# Patient Record
Sex: Male | Born: 2000 | Race: Black or African American | Hispanic: No | Marital: Single | Smoking: Never smoker
Health system: Southern US, Community
[De-identification: ages and names within clinical notes are randomized; demographics above are authoritative.]

## PROBLEM LIST (undated history)

## (undated) DIAGNOSIS — J45909 Unspecified asthma, uncomplicated: Secondary | ICD-10-CM

---
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
## 2023-01-08 ENCOUNTER — Other Ambulatory Visit: Payer: Self-pay

## 2023-01-08 ENCOUNTER — Encounter (HOSPITAL_COMMUNITY): Payer: Self-pay

## 2023-01-08 ENCOUNTER — Emergency Department (HOSPITAL_COMMUNITY)
Admission: EM | Admit: 2023-01-08 | Discharge: 2023-01-08 | Disposition: A | Discharge status: Home / Self Care | Payer: Medicaid Other | Attending: Student | Admitting: Student

## 2023-01-08 DIAGNOSIS — J45909 Unspecified asthma, uncomplicated: Secondary | ICD-10-CM | POA: Diagnosis not present

## 2023-01-08 DIAGNOSIS — J392 Other diseases of pharynx: Secondary | ICD-10-CM

## 2023-01-08 DIAGNOSIS — R0602 Shortness of breath: Secondary | ICD-10-CM | POA: Diagnosis not present

## 2023-01-08 DIAGNOSIS — R220 Localized swelling, mass and lump, head: Secondary | ICD-10-CM | POA: Insufficient documentation

## 2023-01-08 DIAGNOSIS — F419 Anxiety disorder, unspecified: Secondary | ICD-10-CM

## 2023-01-08 HISTORY — DX: Unspecified asthma, uncomplicated: J45.909

## 2023-01-08 MED ORDER — VICKS MINI COOLMIST HUMIDIFIER MISC: 1.0000 | 0 refills | Status: DC | PRN

## 2023-01-08 NOTE — ED Provider Notes (Signed)
[facility] EMERGENCY DEPARTMENT AT [facility] Provider Note  CSN: [id number] Arrival date & time: 01/08/23 2020  Chief Complaint(s) Oral Swelling  HPI Taylor Walls is a 22 y.o. male with PMH asthma, anxiety who presents emergency department for evaluation of concern for oropharyngeal swelling and a feeling of shortness of breath where he "feels like he is drinking out of a straw".  He states that he was seen at [facility] and ultimately discharged with Atarax but this has not been helping.  He states that he used to self medicate his anxiety with marijuana but since not smoked any marijuana for over 2 months.  He states that his symptoms began abruptly during finals week and have persisted since graduation.  He states that he feels immense pressure in his personal life and has multiple stressors at home that seem to be exacerbating his symptoms.  He states that when he is physically active, he does not feel this but when he is alone and thinking about the symptoms they seem to become way worse.  He states he feels that his throat becomes very dry and he has trouble breathing.  He denies dysphagia, stridor, sore throat, chest pain or any other systemic symptoms.   Past Medical History Past Medical History:  Diagnosis Date   Asthma    Patient Active Problem List   Diagnosis Date Noted   Left leg pain 12/06/2017   Home Medication(s) Prior to Admission medications   Medication Sig Start Date End Date Taking? Authorizing Provider  HYDROcodone-acetaminophen (NORCO/VICODIN) 5-325 MG tablet Take 1 tablet by mouth every 4 (four) hours as needed for severe pain. Patient not taking: Reported on 12/15/2017 11/14/17   Cristina Gong, PA-C  ondansetron (ZOFRAN ODT) 4 MG disintegrating tablet Take 1 tablet (4 mg total) by mouth every 8 (eight) hours as needed. 05/13/18   Pricilla Loveless, MD                                                                                                                                     Past Surgical History History reviewed. No pertinent surgical history. Family History History reviewed. No pertinent family history.  Social History Social History   Tobacco Use   Smoking status: Never   Smokeless tobacco: Never   Allergies Ibuprofen  Review of Systems Review of Systems  Respiratory:  Positive for chest tightness and shortness of breath.     Physical Exam Vital Signs  I have reviewed the triage vital signs BP (!) 145/84   Pulse (!) 56   Temp 99.7 F (37.6 C) (Oral)   Resp 16   Ht 5\' 10"  (1.778 m)   Wt 79.4 kg   SpO2 99%   BMI 25.11 kg/m   Physical Exam Constitutional:      General: He is not in acute distress.    Appearance: Normal appearance.  HENT:  Head: Normocephalic and atraumatic.     Nose: No congestion or rhinorrhea.  Eyes:     General:        Right eye: No discharge.        Left eye: No discharge.     Extraocular Movements: Extraocular movements intact.     Pupils: Pupils are equal, round, and reactive to light.  Cardiovascular:     Rate and Rhythm: Normal rate and regular rhythm.     Heart sounds: No murmur heard. Pulmonary:     Effort: No respiratory distress.     Breath sounds: No wheezing or rales.  Abdominal:     General: There is no distension.     Tenderness: There is no abdominal tenderness.  Musculoskeletal:        General: Normal range of motion.     Cervical back: Normal range of motion.  Skin:    General: Skin is warm and dry.  Neurological:     General: No focal deficit present.     Mental Status: He is alert.     ED Results and Treatments Labs (all labs ordered are listed, but only abnormal results are displayed) Labs Reviewed - No data to display                                                                                                                        Radiology No results found.  Pertinent labs & imaging results that were available  during my care of the patient were reviewed by me and considered in my medical decision making (see MDM for details).  Medications Ordered in ED Medications - No data to display                                                                                                                                   Procedures Procedures  (including critical care time)  Medical Decision Making / ED Course   This patient presents to the ED for concern of shortness of breath this involves an extensive number of treatment options, and is a complaint that carries with it a high risk of complications and morbidity.  The differential diagnosis includes anxiety, panic disorder, somatic symptom disorder, PTA, RPA, uvulitis, asthma  MDM: Patient seen emergency room for evaluation of multiple complaints described above.  Physical exam is unremarkable with no appreciable oropharyngeal swelling, no tonsillar swelling, no oropharyngeal erythema.  Patient has full range of motion of the neck and pulmonary exam is unremarkable with no appreciable wheezing.  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  He currently denies SI, HI AH or VH and does not meet inpatient psychiatric evaluation criteria.  I gave him resources to follow-up with the [facility] and encouraged him to use the Atarax as needed.  Will trial some Vicks humidified vapor for his dry throat.  I did offer the patient an x-ray soft tissue neck but at this time, patient would likely not benefit from CT imaging of the neck or additional laboratory workup in the ER.  Patient declined further imaging which I think is reasonable.  Patient voiced understanding of this plan  and is in agreement with this plan.  He will return to the emergency department if symptoms significantly worsen or he develops stridor, dysphagia or exertional shortness of breath.  Patient dentist urged with outpatient [facility] follow-up and return precautions   Additional history obtained: -External records from outside source obtained and reviewed including: Chart review including previous notes, labs, imaging, consultation notes   Medicines ordered and prescription drug management: No orders of the defined types were placed in this encounter.   -I have reviewed the patients home medicines and have made adjustments as needed  Critical interventions none    Cardiac Monitoring: The patient was maintained on a cardiac monitor.  I personally viewed and interpreted the cardiac monitored which showed an underlying rhythm of: NSR  Social Determinants of Health:  Factors impacting patients care include: Multiple stressors in his life exacerbating symptoms   Reevaluation: After the interventions noted above, I reevaluated the patient and found that they have :improved  Co morbidities that complicate the patient evaluation  Past Medical History:  Diagnosis Date   Asthma       Dispostion: I considered admission for this patient, but at this time he does not meet inpatient criteria for admission he is safe for discharge with outpatient follow-up     Final Clinical Impression(s) / ED Diagnoses Final diagnoses:  None     @PCDICTATION @    Glendora Score, MD 01/08/23 2159

## 2023-01-08 NOTE — ED Triage Notes (Signed)
Pt arrives c/o swelling to back of throat that has been intermittent x1 month. Denies sore throat or painful swallowing. Denies fevers/chills. States that the swelling makes him feel like he's "breathing through a straw" at times. Able to handle secretions and speak in full sentences during triage.  Was seen in ED about a month ago for similar symptoms and prescribed 3 day course of atarax and albuterol inhaler, but states that didn't help.

## 2023-01-14 ENCOUNTER — Encounter: Payer: Self-pay | Admitting: Family Medicine

## 2023-01-14 ENCOUNTER — Ambulatory Visit (INDEPENDENT_AMBULATORY_CARE_PROVIDER_SITE_OTHER): Payer: Medicaid Other | Admitting: Family Medicine

## 2023-01-14 VITALS — BP 134/82 | HR 56 | Temp 97.8°F | Ht 69.5 in | Wt 169.2 lb

## 2023-01-14 DIAGNOSIS — F4322 Adjustment disorder with anxiety: Secondary | ICD-10-CM

## 2023-01-14 DIAGNOSIS — J029 Acute pharyngitis, unspecified: Secondary | ICD-10-CM

## 2023-01-14 DIAGNOSIS — K219 Gastro-esophageal reflux disease without esophagitis: Secondary | ICD-10-CM | POA: Diagnosis not present

## 2023-01-14 DIAGNOSIS — H6122 Impacted cerumen, left ear: Secondary | ICD-10-CM | POA: Diagnosis not present

## 2023-01-14 LAB — POCT RAPID STREP A (OFFICE): Rapid Strep A Screen: NEGATIVE

## 2023-01-14 LAB — POCT MONO (EPSTEIN BARR VIRUS): Mono, POC: NEGATIVE

## 2023-01-14 MED ORDER — ACETIC ACID 2 % OT SOLN: 4.0000 [drp] | OTIC | 0 refills | Status: AC

## 2023-01-14 MED ORDER — ESCITALOPRAM OXALATE 10 MG PO TABS: ORAL_TABLET | ORAL | 0 refills | Status: DC

## 2023-01-14 MED ORDER — FAMOTIDINE 40 MG PO TABS: 40.0000 mg | ORAL_TABLET | Freq: Every day | ORAL | 0 refills | Status: DC

## 2023-01-14 NOTE — Progress Notes (Signed)
Assessment/Plan:   Problem List Items Addressed This Visit       Respiratory   Pharyngitis - Primary    Differential Diagnosis:  Silent Reflux: The patient's symptoms, including throat discomfort and occasional heartburn, are suggestive of possible silent reflux. Post-Nasal Drip: Given the presence of tonsil stones, post-nasal drip could be causing throat irritation. Infectious Causes: Less likely but considered, including strep throat or chronic bacterial infection.  Plan:  Medication: Prescribe Famotidine (Pepcid) 40 mg tablet at night to manage possible silent reflux. Testing: As ordered to rule out infectious etiologies Follow-up: Evaluate the effectiveness of treatment and follow up in a month for reassessment.      Relevant Medications   famotidine (PEPCID) 40 MG tablet   Other Relevant Orders   POCT rapid strep A (Completed)   HIV antibody (with reflex) (Completed)   POCT Mono (Epstein Barr Virus) (Completed)   GC/CT Probe, Amp (Throat) (Completed)     Digestive   Gastroesophageal reflux disease   Relevant Medications   famotidine (PEPCID) 40 MG tablet     Nervous and Auditory   Impacted cerumen, left ear   Relevant Medications   acetic acid 2 % otic solution     Other   Adjustment disorder with anxiety   Relevant Medications   escitalopram (LEXAPRO) 10 MG tablet    Medications Discontinued During This Encounter  Medication Reason   Humidifiers (VICKS MINI COOLMIST HUMIDIFIER) MISC    HYDROcodone-acetaminophen (NORCO/VICODIN) 5-325 MG tablet    ondansetron (ZOFRAN ODT) 4 MG disintegrating tablet     Return for Sore throat, anxiety.    Subjective:   Encounter date: 01/14/2023  Taylor Walls is a 22 y.o. male who has Left leg pain; Pharyngitis; Gastroesophageal reflux disease; Adjustment disorder with anxiety; and Impacted cerumen, left ear on their problem list..   He  has a past medical history of Asthma..   CHIEF COMPLAINT: The patient  presents with throat discomfort and anxiety concerns.  HISTORY OF PRESENT ILLNESS:   The patient, Taylor Walls, a 22 year old male, reports that he has been experiencing throat swelling and discomfort for the past month and a half, particularly describing it as feeling like "breathing through a straw." He notes that his tonsils feel inflamed and he has coughed up small tonsil stones. The patient has ceased vaping and smoking two months ago and denies any current use. He also reports unmanaged anxiety exacerbating these symptoms. The concern has led to multiple ER visits since April.       01/14/2023   10:38 AM  Depression screen PHQ 2/9  Decreased Interest 1  Down, Depressed, Hopeless 1  PHQ - 2 Score 2  Altered sleeping 2  Tired, decreased energy 1  Change in appetite 0  Feeling bad or failure about yourself  0  Trouble concentrating 0  Moving slowly or fidgety/restless 2  Suicidal thoughts 0  PHQ-9 Score 7  Difficult doing work/chores Somewhat difficult      01/14/2023   10:38 AM  GAD 7 : Generalized Anxiety Score  Nervous, Anxious, on Edge 3  Control/stop worrying 3  Worry too much - different things 3  Trouble relaxing 3  Restless 3  Easily annoyed or irritable 1  Afraid - awful might happen 1  Total GAD 7 Score 17  Anxiety Difficulty Somewhat difficult      Review of Systems  Constitutional:  Negative for chills, diaphoresis, fever, malaise/fatigue and weight loss.  HENT:  Negative for congestion, ear  discharge, ear pain and hearing loss.        Throat swelling, tonsil screen  Eyes:  Negative for blurred vision, double vision, photophobia, pain, discharge and redness.  Respiratory:  Positive for shortness of breath. Negative for cough, sputum production and wheezing.   Cardiovascular:  Negative for chest pain and palpitations.  Gastrointestinal:  Positive for heartburn (Occasional, improved with Tums). Negative for abdominal pain, blood in stool, constipation,  diarrhea, melena, nausea and vomiting.  Genitourinary:  Negative for dysuria, flank pain, frequency, hematuria and urgency.  Musculoskeletal:  Negative for myalgias.  Skin:  Negative for itching and rash.  Neurological:  Negative for dizziness, tingling, tremors, speech change, seizures, loss of consciousness, weakness and headaches.  Psychiatric/Behavioral:  Negative for depression, hallucinations, memory loss, substance abuse and suicidal ideas. The patient is nervous/anxious. The patient does not have insomnia.   All other systems reviewed and are negative.     01/14/2023   10:38 AM  Depression screen PHQ 2/9  Decreased Interest 1  Down, Depressed, Hopeless 1  PHQ - 2 Score 2  Altered sleeping 2  Tired, decreased energy 1  Change in appetite 0  Feeling bad or failure about yourself  0  Trouble concentrating 0  Moving slowly or fidgety/restless 2  Suicidal thoughts 0  PHQ-9 Score 7  Difficult doing work/chores Somewhat difficult      01/14/2023   10:38 AM  GAD 7 : Generalized Anxiety Score  Nervous, Anxious, on Edge 3  Control/stop worrying 3  Worry too much - different things 3  Trouble relaxing 3  Restless 3  Easily annoyed or irritable 1  Afraid - awful might happen 1  Total GAD 7 Score 17  Anxiety Difficulty Somewhat difficult     History reviewed. No pertinent surgical history.  Outpatient Medications Prior to Visit  Medication Sig Dispense Refill   Humidifiers (VICKS MINI COOLMIST HUMIDIFIER) MISC 1 each by Does not apply route as needed (for dry throat). (Patient not taking: Reported on 01/14/2023) 1 each 0   HYDROcodone-acetaminophen (NORCO/VICODIN) 5-325 MG tablet Take 1 tablet by mouth every 4 (four) hours as needed for severe pain. (Patient not taking: Reported on 12/15/2017) 5 tablet 0   ondansetron (ZOFRAN ODT) 4 MG disintegrating tablet Take 1 tablet (4 mg total) by mouth every 8 (eight) hours as needed. (Patient not taking: Reported on 01/14/2023) 10 tablet  0   No facility-administered medications prior to visit.    History reviewed. No pertinent family history.  Social History   Socioeconomic History   Marital status: Single    Spouse name: Not on file   Number of children: Not on file   Years of education: Not on file   Highest education level: Not on file  Occupational History   Not on file  Tobacco Use   Smoking status: Never    Passive exposure: Never   Smokeless tobacco: Never  Vaping Use   Vaping Use: Former  Substance and Sexual Activity   Alcohol use: Never   Drug use: Not Currently    Types: Marijuana   Sexual activity: Yes    Birth control/protection: Condom  Other Topics Concern   Not on file  Social History Narrative   Not on file   Social Determinants of Health   Financial Resource Strain: Not on file  Food Insecurity: Not on file  Transportation Needs: Not on file  Physical Activity: Not on file  Stress: Not on file  Social Connections: Not on file  Intimate Partner Violence: Not on file                                                                                                  Objective:  Physical Exam: BP 134/82 (BP Location: Left Arm, Patient Position: Sitting, Cuff Size: Large)   Pulse (!) 56   Temp 97.8 F (36.6 C) (Temporal)   Ht 5' 9.5" (1.765 m)   Wt 169 lb 3.2 oz (76.7 kg)   SpO2 99%   BMI 24.63 kg/m     Physical Exam Constitutional:      Appearance: Normal appearance.  HENT:     Head: Normocephalic and atraumatic.     Right Ear: Hearing normal.     Left Ear: Hearing normal. There is impacted cerumen.     Nose: Nose normal.     Mouth/Throat:     Lips: Pink.     Mouth: Mucous membranes are moist. No oral lesions.  Eyes:     General: No scleral icterus.       Right eye: No discharge.        Left eye: No discharge.     Extraocular Movements: Extraocular movements intact.  Cardiovascular:     Rate and Rhythm: Normal rate and regular rhythm.     Heart sounds: Normal  heart sounds.  Pulmonary:     Effort: Pulmonary effort is normal.     Breath sounds: Normal breath sounds.  Abdominal:     Palpations: Abdomen is soft.     Tenderness: There is no abdominal tenderness.  Skin:    General: Skin is warm.     Findings: No rash.  Neurological:     General: No focal deficit present.     Mental Status: He is alert.     Cranial Nerves: No cranial nerve deficit.  Psychiatric:        Mood and Affect: Mood normal.        Behavior: Behavior normal.        Thought Content: Thought content normal.        Judgment: Judgment normal.     No results found.  Recent Results (from the past 2160 hour(s))  HIV antibody (with reflex)     Status: None   Collection Time: 01/14/23 11:37 AM  Result Value Ref Range   HIV 1&2 Ab, 4th Generation NON-REACTIVE NON-REACTIVE    Comment: HIV-1 antigen and HIV-1/HIV-2 antibodies were not detected. There is no laboratory evidence of HIV infection. Marland Kitchen PLEASE NOTE: This information has been disclosed to you from records whose confidentiality may be protected by state law.  If your state requires such protection, then the state law prohibits you from making any further disclosure of the information without the specific written consent of the person to whom it pertains, or as otherwise permitted by law. A general authorization for the release of medical or other information is NOT sufficient for this purpose. . For additional information please refer to http://education.[facility].com/faq/FAQ106 (This link is being provided for informational/ educational purposes only.) . Marland Kitchen The performance of this assay has not been clinically  validated in patients less than 72 years old. Marland Kitchen   GC/CT Probe, Amp (Throat)     Status: None   Collection Time: 01/14/23 11:37 AM   Specimen: Blood  Result Value Ref Range   Chlamydia trachomatis RNA NOT DETECTED NOT DETECTED   Neisseria gonorrhoeae RNA NOT DETECTED NOT DETECTED     Comment: The analytical performance characteristics of this assay have been determined by [employer]. The modifications have not been cleared or approved by the FDA. This assay has been validated pursuant to the CLIA regulations and is used for clinical purposes. Marland Kitchen   POCT rapid strep A     Status: None   Collection Time: 01/14/23 11:43 AM  Result Value Ref Range   Rapid Strep A Screen Negative Negative  POCT Mono (Epstein Barr Virus)     Status: None   Collection Time: 01/14/23 11:43 AM  Result Value Ref Range   Mono, POC Negative Negative        Garner Nash, MD, MS

## 2023-01-14 NOTE — Patient Instructions (Addendum)
For sore throat.  Doing testing as described. For acid reflux, start famotidine as prescribed. For anxiety, start escitalopram as described.  Keep a journal of throat symptoms and potential triggers such as foods or mood.

## 2023-01-15 LAB — GC/CHLAMYDIA PROBE, AMP (THROAT)
Chlamydia trachomatis RNA: NOT DETECTED
Neisseria gonorrhoeae RNA: NOT DETECTED

## 2023-01-15 LAB — HIV ANTIBODY (ROUTINE TESTING W REFLEX): HIV 1&2 Ab, 4th Generation: NONREACTIVE

## 2023-01-18 DIAGNOSIS — H6122 Impacted cerumen, left ear: Secondary | ICD-10-CM | POA: Insufficient documentation

## 2023-01-18 DIAGNOSIS — K219 Gastro-esophageal reflux disease without esophagitis: Secondary | ICD-10-CM | POA: Insufficient documentation

## 2023-01-18 DIAGNOSIS — F4322 Adjustment disorder with anxiety: Secondary | ICD-10-CM | POA: Insufficient documentation

## 2023-01-18 DIAGNOSIS — J029 Acute pharyngitis, unspecified: Secondary | ICD-10-CM | POA: Insufficient documentation

## 2023-01-18 NOTE — Assessment & Plan Note (Signed)
Differential Diagnosis:  Silent Reflux: The patient's symptoms, including throat discomfort and occasional heartburn, are suggestive of possible silent reflux. Post-Nasal Drip: Given the presence of tonsil stones, post-nasal drip could be causing throat irritation. Infectious Causes: Less likely but considered, including strep throat or chronic bacterial infection.  Plan:  Medication: Prescribe Famotidine (Pepcid) 40 mg tablet at night to manage possible silent reflux. Testing: As ordered to rule out infectious etiologies Follow-up: Evaluate the effectiveness of treatment and follow up in a month for reassessment.

## 2023-01-18 NOTE — Assessment & Plan Note (Signed)
Medication: Start Escitalopram (Lexapro) 10 mg tablet for anxiety. Initial dosage of 5 mg for two weeks, then increase to 10 mg. Mental Health Support: Recommend therapy and possibly connect with a mental health professional.

## 2023-02-11 ENCOUNTER — Ambulatory Visit (INDEPENDENT_AMBULATORY_CARE_PROVIDER_SITE_OTHER): Payer: Medicaid Other | Admitting: Family Medicine

## 2023-02-11 ENCOUNTER — Encounter: Payer: Self-pay | Admitting: Family Medicine

## 2023-02-11 VITALS — BP 120/70 | HR 61 | Temp 97.0°F | Wt 170.4 lb

## 2023-02-11 DIAGNOSIS — F4322 Adjustment disorder with anxiety: Secondary | ICD-10-CM

## 2023-02-11 DIAGNOSIS — J029 Acute pharyngitis, unspecified: Secondary | ICD-10-CM

## 2023-02-11 MED ORDER — ESCITALOPRAM OXALATE 10 MG PO TABS: 10.0000 mg | ORAL_TABLET | Freq: Every day | ORAL | 0 refills | Status: AC

## 2023-02-11 NOTE — Progress Notes (Signed)
Assessment/Plan:   Problem List Items Addressed This Visit       Respiratory   Pharyngitis - Primary    The patient's throat discomfort has improved significantly likely anxiety-related throat discomfort:  Plan:  Advise continued hydration. Monitor symptoms; if throat discomfort recurs, consider further evaluation.        Other   Adjustment disorder with anxiety    The patient has shown significant improvement in anxiety symptoms with escitalopram, reducing the score from 17 to 3. The patient manages the medication well and reports no major side effects affecting daily activities.  Plan:  Continue with escitalopram 10 mg daily. Schedule follow-up in 3 months to monitor ongoing progress. Discontinue Pepcid due to dry mouth side effects; advise patient to hydrate if necessary. Encourage the patient to continue with daily activities that help alleviate symptoms.      Relevant Medications   escitalopram (LEXAPRO) 10 MG tablet    Medications Discontinued During This Encounter  Medication Reason   escitalopram (LEXAPRO) 10 MG tablet Reorder   famotidine (PEPCID) 40 MG tablet     Return in about 3 months (around 05/14/2023) for anxiety .    Subjective:   Encounter date: 02/11/2023  Taylor Walls is a 22 y.o. male who has Left leg pain; Pharyngitis; Gastroesophageal reflux disease; Adjustment disorder with anxiety; and Impacted cerumen, left ear on their problem list..   He  has a past medical history of Asthma..   Anxiety. The patient reports significant improvement in anxiety symptoms since starting escitalopram. Initially prescribed with a dosage of 5 mg, then increased to 10 mg. The patient confirms taking the medication in the morning, which has been effective in reducing anxiety, lowering the score from 17 to 3, and helping with breathing-related anxiety. No major side effects noted.   Throat Discomfort.  The patient's previous throat discomfort has significantly  improved. The patient noted a dry mouth with Pepcid, particularly waking up in the middle of the night, and has since stopped taking it. Throat feels better when eating, possibly due to salivary activity.     02/11/2023    9:11 AM 01/14/2023   10:38 AM  Depression screen PHQ 2/9  Decreased Interest 0 1  Down, Depressed, Hopeless 1 1  PHQ - 2 Score 1 2  Altered sleeping 1 2  Tired, decreased energy 1 1  Change in appetite 0 0  Feeling bad or failure about yourself  0 0  Trouble concentrating 1 0  Moving slowly or fidgety/restless 1 2  Suicidal thoughts 0 0  PHQ-9 Score 5 7  Difficult doing work/chores Somewhat difficult Somewhat difficult      02/11/2023    9:12 AM 01/14/2023   10:38 AM  GAD 7 : Generalized Anxiety Score  Nervous, Anxious, on Edge 1 3  Control/stop worrying 0 3  Worry too much - different things 1 3  Trouble relaxing 0 3  Restless 1 3  Easily annoyed or irritable 0 1  Afraid - awful might happen 0 1  Total GAD 7 Score 3 17  Anxiety Difficulty Not difficult at all Somewhat difficult      Review of Systems  Constitutional:  Negative for chills, diaphoresis, fever, malaise/fatigue and weight loss.  HENT:  Positive for sore throat (improved). Negative for congestion, ear discharge, ear pain and hearing loss.   Eyes:  Negative for blurred vision, double vision, photophobia, pain, discharge and redness.  Respiratory:  Negative for cough, sputum production, shortness of breath  and wheezing.   Cardiovascular:  Negative for chest pain and palpitations.  Gastrointestinal:  Negative for abdominal pain, blood in stool, constipation, diarrhea, heartburn, melena, nausea and vomiting.  Genitourinary:  Negative for dysuria, flank pain, frequency, hematuria and urgency.  Musculoskeletal:  Negative for myalgias.  Skin:  Negative for itching and rash.  Neurological:  Negative for dizziness, tingling, tremors, speech change, seizures, loss of consciousness, weakness and  headaches.  Psychiatric/Behavioral:  Negative for depression, hallucinations, memory loss, substance abuse and suicidal ideas. The patient is nervous/anxious (improved). The patient does not have insomnia.   All other systems reviewed and are negative.   No past surgical history on file.  Outpatient Medications Prior to Visit  Medication Sig Dispense Refill   escitalopram (LEXAPRO) 10 MG tablet Take 0.5 tablets (5 mg total) by mouth daily for 14 days, THEN 1 tablet (10 mg total) daily for 16 days. 23 tablet 0   famotidine (PEPCID) 40 MG tablet Take 1 tablet (40 mg total) by mouth at bedtime. (Patient not taking: Reported on 02/11/2023) 30 tablet 0   No facility-administered medications prior to visit.    No family history on file.  Social History   Socioeconomic History   Marital status: Single    Spouse name: Not on file   Number of children: Not on file   Years of education: Not on file   Highest education level: Not on file  Occupational History   Not on file  Tobacco Use   Smoking status: Never    Passive exposure: Never   Smokeless tobacco: Never  Vaping Use   Vaping Use: Former  Substance and Sexual Activity   Alcohol use: Never   Drug use: Not Currently    Types: Marijuana   Sexual activity: Yes    Birth control/protection: Condom  Other Topics Concern   Not on file  Social History Narrative   Not on file   Social Determinants of Health   Financial Resource Strain: Not on file  Food Insecurity: Not on file  Transportation Needs: Not on file  Physical Activity: Not on file  Stress: Not on file  Social Connections: Not on file  Intimate Partner Violence: Not on file                                                                                                  Objective:  Physical Exam: BP 120/70 (BP Location: Left Arm, Patient Position: Sitting, Cuff Size: Large)   Pulse 61   Temp (!) 97 F (36.1 C) (Temporal)   Wt 170 lb 6.4 oz (77.3 kg)   SpO2  98%   BMI 24.80 kg/m     Physical Exam Constitutional:      Appearance: Normal appearance.  HENT:     Head: Normocephalic and atraumatic.     Right Ear: Hearing normal.     Left Ear: Hearing normal.     Nose: Nose normal.     Mouth/Throat:     Lips: Pink.     Mouth: Mucous membranes are moist. No oral lesions.  Eyes:  General: No scleral icterus.       Right eye: No discharge.        Left eye: No discharge.     Extraocular Movements: Extraocular movements intact.  Cardiovascular:     Rate and Rhythm: Normal rate and regular rhythm.     Heart sounds: Normal heart sounds.  Pulmonary:     Effort: Pulmonary effort is normal.     Breath sounds: Normal breath sounds.  Abdominal:     Palpations: Abdomen is soft.     Tenderness: There is no abdominal tenderness.  Skin:    General: Skin is warm.     Findings: No rash.  Neurological:     General: No focal deficit present.     Mental Status: He is alert.     Cranial Nerves: No cranial nerve deficit.  Psychiatric:        Mood and Affect: Mood normal.        Behavior: Behavior normal.        Thought Content: Thought content normal.        Judgment: Judgment normal.     No results found.  Recent Results (from the past 2160 hour(s))  HIV antibody (with reflex)     Status: None   Collection Time: 01/14/23 11:37 AM  Result Value Ref Range   HIV 1&2 Ab, 4th Generation NON-REACTIVE NON-REACTIVE    Comment: HIV-1 antigen and HIV-1/HIV-2 antibodies were not detected. There is no laboratory evidence of HIV infection. Marland Kitchen PLEASE NOTE: This information has been disclosed to you from records whose confidentiality may be protected by state law.  If your state requires such protection, then the state law prohibits you from making any further disclosure of the information without the specific written consent of the person to whom it pertains, or as otherwise permitted by law. A general authorization for the release of medical  or other information is NOT sufficient for this purpose. . For additional information please refer to http://education.[facility].com/faq/FAQ106 (This link is being provided for informational/ educational purposes only.) . Marland Kitchen The performance of this assay has not been clinically validated in patients less than 3 years old. Marland Kitchen   GC/CT Probe, Amp (Throat)     Status: None   Collection Time: 01/14/23 11:37 AM   Specimen: Blood  Result Value Ref Range   Chlamydia trachomatis RNA NOT DETECTED NOT DETECTED   Neisseria gonorrhoeae RNA NOT DETECTED NOT DETECTED    Comment: The analytical performance characteristics of this assay have been determined by [employer]. The modifications have not been cleared or approved by the FDA. This assay has been validated pursuant to the CLIA regulations and is used for clinical purposes. Marland Kitchen   POCT rapid strep A     Status: None   Collection Time: 01/14/23 11:43 AM  Result Value Ref Range   Rapid Strep A Screen Negative Negative  POCT Mono (Epstein Barr Virus)     Status: None   Collection Time: 01/14/23 11:43 AM  Result Value Ref Range   Mono, POC Negative Negative        Garner Nash, MD, MS

## 2023-02-13 NOTE — Assessment & Plan Note (Signed)
The patient has shown significant improvement in anxiety symptoms with escitalopram, reducing the score from 17 to 3. The patient manages the medication well and reports no major side effects affecting daily activities.  Plan:  Continue with escitalopram 10 mg daily. Schedule follow-up in 3 months to monitor ongoing progress. Discontinue Pepcid due to dry mouth side effects; advise patient to hydrate if necessary. Encourage the patient to continue with daily activities that help alleviate symptoms.

## 2023-02-13 NOTE — Assessment & Plan Note (Signed)
The patient's throat discomfort has improved significantly likely anxiety-related throat discomfort:  Plan:  Advise continued hydration. Monitor symptoms; if throat discomfort recurs, consider further evaluation.

## 2023-04-16 ENCOUNTER — Encounter (INDEPENDENT_AMBULATORY_CARE_PROVIDER_SITE_OTHER): Payer: Self-pay

## 2023-05-15 ENCOUNTER — Telehealth: Payer: Self-pay | Admitting: Family Medicine

## 2023-05-15 ENCOUNTER — Ambulatory Visit: Payer: Medicaid Other | Admitting: Family Medicine

## 2023-05-15 NOTE — Telephone Encounter (Signed)
1st no show, letter sent via mail

## 2023-05-15 NOTE — Telephone Encounter (Signed)
Pt was a no show for an OV with Dr Janee Morn on 05/15/23, I sent a letter.

## 2023-07-01 ENCOUNTER — Telehealth: Payer: Self-pay

## 2023-07-01 NOTE — Transitions of Care (Post Inpatient/ED Visit) (Signed)
   07/01/2023  Name: Taylor Walls MRN: [id number] DOB: [birth year]  Today's TOC FU Call Status: Today's TOC FU Call Status:: Unsuccessful Call (1st Attempt) Unsuccessful Call (1st Attempt) Date: 07/01/23  Attempted to reach the patient regarding the most recent Inpatient/ED visit.  Follow Up Plan: Additional outreach attempts will be made to reach the patient to complete the Transitions of Care (Post Inpatient/ED visit) call.   Signature Arvil Persons, BSN, Charity fundraiser

## 2023-07-02 NOTE — Transitions of Care (Post Inpatient/ED Visit) (Signed)
   07/02/2023  Name: Taylor Walls MRN: [id number] DOB: 2001-02-26  Today's TOC FU Call Status: Today's TOC FU Call Status:: Unsuccessful Call (2nd Attempt) Unsuccessful Call (1st Attempt) Date: 07/01/23 Unsuccessful Call (2nd Attempt) Date: 07/02/23  Attempted to reach the patient regarding the most recent Inpatient/ED visit.  Follow Up Plan: Additional outreach attempts will be made to reach the patient to complete the Transitions of Care (Post Inpatient/ED visit) call.   Signature Arvil Persons, BSN, Charity fundraiser

## 2023-07-03 NOTE — Transitions of Care (Post Inpatient/ED Visit) (Signed)
   07/03/2023  Name: Taylor Walls MRN: [id number] DOB: December 19, 2000  Today's TOC FU Call Status: Today's TOC FU Call Status:: Unsuccessful Call (3rd Attempt) Unsuccessful Call (1st Attempt) Date: 07/01/23 Unsuccessful Call (2nd Attempt) Date: 07/02/23  Attempted to reach the patient regarding the most recent Inpatient/ED visit.  Follow Up Plan: No further outreach attempts will be made at this time. We have been unable to contact the patient.  Signature Arvil Persons, BSN, Charity fundraiser

## 2023-07-13 ENCOUNTER — Telehealth: Payer: Self-pay

## 2023-07-13 NOTE — Transitions of Care (Post Inpatient/ED Visit) (Signed)
   07/13/2023  Name: JOAQUIN LINARES MRN: [id number] DOB: [birth year]  Today's TOC FU Call Status: Today's TOC FU Call Status:: Successful TOC FU Call Completed TOC FU Call Complete Date: 07/13/23 Patient's Name and Date of Birth confirmed.  Transition Care Management Follow-up Telephone Call Date of Discharge: 07/10/23 Discharge Facility: Other ([facility]) Name of Other ([facility]) Discharge Facility: [facility] Type of Discharge: Emergency Department Reason for ED Visit: Mental or Mood Disorder Mental or Mood Disorder Diagnosis: Anxiety How have you been since you were released from the hospital?: Same Any questions or concerns?: Yes Patient Questions/Concerns:: Running out of medication soon  Items Reviewed: Did you receive and understand the discharge instructions provided?: Yes Medications obtained,verified, and reconciled?: Yes (Medications Reviewed) Any new allergies since your discharge?: No Dietary orders reviewed?: NA Do you have support at home?: Yes  Medications Reviewed Today: Medications Reviewed Today   Medications were not reviewed in this encounter     Home Care and Equipment/Supplies: Were Home Health Services Ordered?: NA Any new equipment or medical supplies ordered?: NA  Functional Questionnaire: Do you need assistance with bathing/showering or dressing?: No Do you need assistance with meal preparation?: No Do you need assistance with eating?: No Do you have difficulty maintaining continence: No Do you need assistance with getting out of bed/getting out of a chair/moving?: No Do you have difficulty managing or taking your medications?: No  Follow up appointments reviewed: PCP Follow-up appointment confirmed?: No Specialist Hospital Follow-up appointment confirmed?: No Do you need transportation to your follow-up appointment?: No Do you understand care options if your condition(s) worsen?: Yes-patient verbalized  understanding    SIGNATURE Arvil Persons, BSN, RN
